# Patient Record
Sex: Female | Born: 1935 | Race: White | Hispanic: No
Health system: Southern US, Community
[De-identification: ages and names within clinical notes are randomized; demographics above are authoritative.]

---

## 2005-08-29 ENCOUNTER — Inpatient Hospital Stay: Payer: Self-pay | Admitting: Unknown Physician Specialty

## 2006-04-27 ENCOUNTER — Ambulatory Visit: Payer: Self-pay

## 2006-07-21 ENCOUNTER — Ambulatory Visit: Payer: Self-pay | Admitting: Otolaryngology

## 2006-07-21 ENCOUNTER — Other Ambulatory Visit: Payer: Self-pay

## 2006-07-30 ENCOUNTER — Ambulatory Visit: Payer: Self-pay | Admitting: Otolaryngology

## 2006-11-29 ENCOUNTER — Emergency Department: Payer: Self-pay | Admitting: Emergency Medicine

## 2007-09-09 ENCOUNTER — Ambulatory Visit: Payer: Self-pay | Admitting: Ophthalmology

## 2007-09-15 ENCOUNTER — Ambulatory Visit: Payer: Self-pay | Admitting: Ophthalmology

## 2007-09-15 ENCOUNTER — Other Ambulatory Visit: Payer: Self-pay

## 2007-10-11 ENCOUNTER — Ambulatory Visit: Payer: Self-pay | Admitting: Ophthalmology

## 2008-03-05 ENCOUNTER — Inpatient Hospital Stay: Payer: Self-pay | Admitting: Internal Medicine

## 2008-03-05 ENCOUNTER — Other Ambulatory Visit: Payer: Self-pay

## 2008-03-20 ENCOUNTER — Ambulatory Visit: Payer: Self-pay | Admitting: Specialist

## 2008-03-23 ENCOUNTER — Ambulatory Visit: Payer: Self-pay | Admitting: Internal Medicine

## 2008-06-26 ENCOUNTER — Ambulatory Visit: Payer: Self-pay | Admitting: Gastroenterology

## 2008-07-29 ENCOUNTER — Emergency Department: Payer: Self-pay | Admitting: Emergency Medicine

## 2008-11-22 ENCOUNTER — Emergency Department: Payer: Self-pay | Admitting: Emergency Medicine

## 2008-12-07 ENCOUNTER — Inpatient Hospital Stay: Payer: Self-pay | Admitting: Internal Medicine

## 2009-07-11 IMAGING — CR DG CHEST 1V PORT
1 series · 1 of 1 positions shown · non-contrast
Comparison: none

REASON FOR EXAM: Altered mental status, leukocytosis
COMMENTS:

[view not recorded]
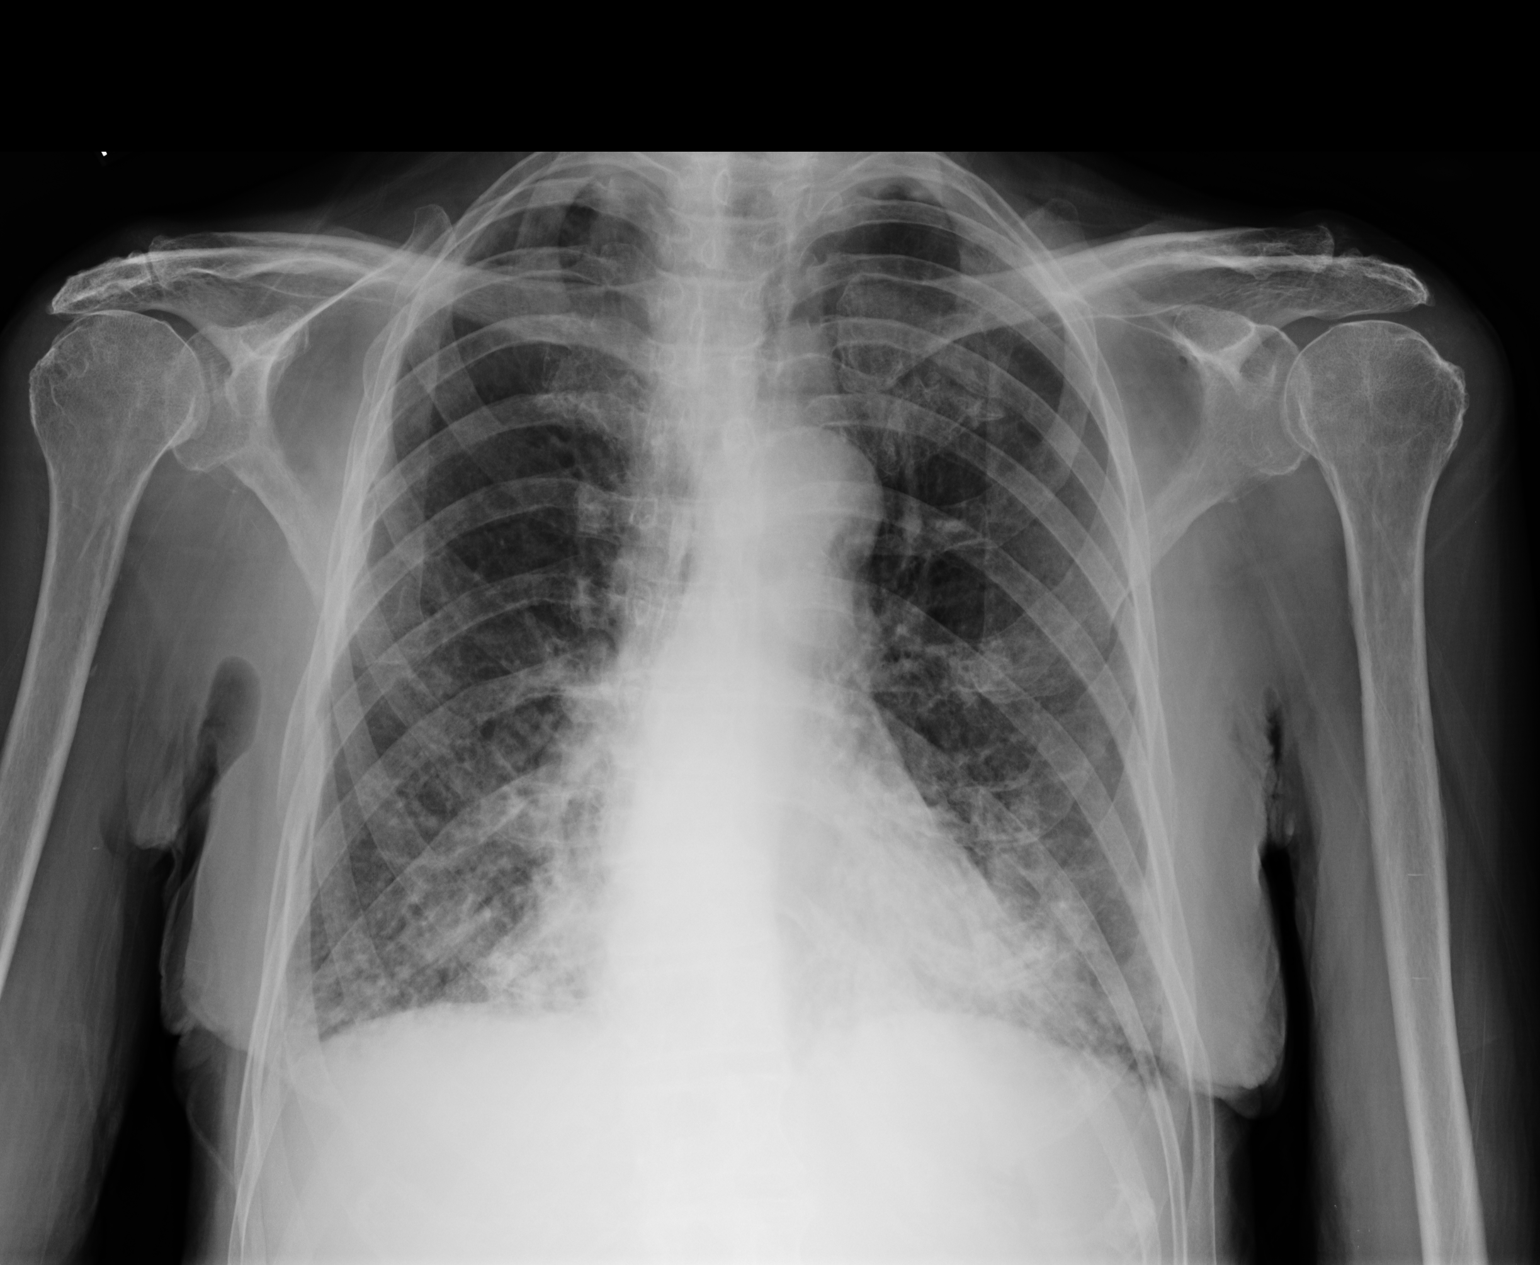

[1 of 1 positions shown; findings below may reference images not displayed]

PROCEDURE:     DXR - DXR PORTABLE CHEST SINGLE VIEW  - December 07, 2008  [DATE]

RESULT:     AP chest is obtained.

Pulmonary interstitial prominence is noted with Kerley B-lines. Mild
pulmonary interstitial edema cannot be completely excluded. Mild pneumonitis
cannot be excluded. There may be a component of interstitial fibrosis.
Follow-up chest x-rays are suggested.
IMPRESSION: Mild bilateral pulmonary interstitial prominence. Developing
congestive heart failure could present in this fashion as could pneumonitis.
Follow-up chest x-rays are suggested.

## 2009-09-07 ENCOUNTER — Emergency Department: Payer: Self-pay | Admitting: Emergency Medicine

## 2011-01-04 ENCOUNTER — Emergency Department: Payer: Self-pay | Admitting: Emergency Medicine

## 2012-04-01 ENCOUNTER — Inpatient Hospital Stay: Payer: Self-pay | Admitting: Internal Medicine

## 2012-04-01 LAB — COMPREHENSIVE METABOLIC PANEL
Bilirubin,Total: 0.7 mg/dL (ref 0.2–1.0)
Calcium, Total: 9.1 mg/dL (ref 8.5–10.1)
Co2: 29 mmol/L (ref 21–32)
EGFR (African American): >60
EGFR (Non-African Amer.): >60
Glucose: 140 mg/dL — ABNORMAL HIGH (ref 65–99)
Potassium: 3.8 mmol/L (ref 3.5–5.1)
SGOT(AST): 14 U/L — ABNORMAL LOW (ref 15–37)
SGPT (ALT): 13 U/L
Sodium: 137 mmol/L (ref 136–145)
Total Protein: 8.7 g/dL — ABNORMAL HIGH (ref 6.4–8.2)

## 2012-04-01 LAB — URINALYSIS, COMPLETE
Bilirubin,UR: NEGATIVE
Blood: NEGATIVE
Glucose,UR: 50 mg/dL (ref 0–75)
Leukocyte Esterase: NEGATIVE
Nitrite: NEGATIVE
Ph: 5 (ref 4.5–8.0)
Protein: 30
RBC,UR: 2 /HPF (ref 0–5)
Specific Gravity: 1.024 (ref 1.003–1.030)
Squamous Epithelial: 1
WBC UR: 1 /HPF (ref 0–5)

## 2012-04-01 LAB — CBC
HCT: 43.7 % (ref 35.0–47.0)
HGB: 14.2 g/dL (ref 12.0–16.0)
MCV: 89 fL (ref 80–100)
Platelet: 275 10*3/uL (ref 150–440)
RBC: 4.89 10*6/uL (ref 3.80–5.20)
RDW: 13.8 % (ref 11.5–14.5)

## 2012-04-02 LAB — CBC WITH DIFFERENTIAL/PLATELET
Basophil %: 0.2 %
Eosinophil #: 0.1 10*3/uL (ref 0.0–0.7)
Eosinophil %: 0.3 %
HCT: 40.9 % (ref 35.0–47.0)
Lymphocyte %: 11.3 %
MCH: 28.5 pg (ref 26.0–34.0)
MCV: 90 fL (ref 80–100)
Monocyte #: 1.9 x10 3/mm — ABNORMAL HIGH (ref 0.2–0.9)
Neutrophil #: 14.1 10*3/uL — ABNORMAL HIGH (ref 1.4–6.5)
WBC: 18.1 10*3/uL — ABNORMAL HIGH (ref 3.6–11.0)

## 2012-04-02 LAB — BASIC METABOLIC PANEL
Anion Gap: 10 (ref 7–16)
BUN: 9 mg/dL (ref 7–18)
Calcium, Total: 9.4 mg/dL (ref 8.5–10.1)
Chloride: 98 mmol/L (ref 98–107)
Creatinine: 0.65 mg/dL (ref 0.60–1.30)
EGFR (African American): >60
EGFR (Non-African Amer.): >60
Glucose: 118 mg/dL — ABNORMAL HIGH (ref 65–99)
Osmolality: 274 (ref 275–301)

## 2012-04-04 LAB — WBC: WBC: 10.5 10*3/uL (ref 3.6–11.0)

## 2012-04-07 LAB — CULTURE, BLOOD (SINGLE)

## 2013-04-04 ENCOUNTER — Emergency Department: Payer: Self-pay | Admitting: Emergency Medicine

## 2013-04-15 ENCOUNTER — Other Ambulatory Visit: Payer: Self-pay | Admitting: Family Medicine

## 2013-04-15 LAB — PRO B NATRIURETIC PEPTIDE: B-Type Natriuretic Peptide: 98 pg/mL (ref 0–450)

## 2013-10-06 ENCOUNTER — Emergency Department: Payer: Self-pay | Admitting: Emergency Medicine

## 2013-10-12 ENCOUNTER — Emergency Department: Payer: Self-pay | Admitting: Internal Medicine

## 2014-05-10 IMAGING — CT CT HEAD WITHOUT CONTRAST
1 series · 15 of 30 positions shown, 19 images · non-contrast
Comparison: none

REASON FOR EXAM: head trauma s/p fall
COMMENTS:   LMP: Post-Menopausal

[Series 2: soft tissue · axial · 0.42mm/px · z∈[+286,+420]mm · 15 of 31 slices shown, 19 images]
[im 2/31  brain]
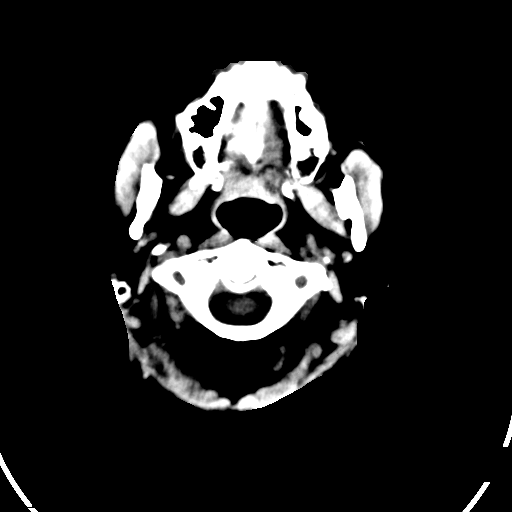
[im 2/31  bone]
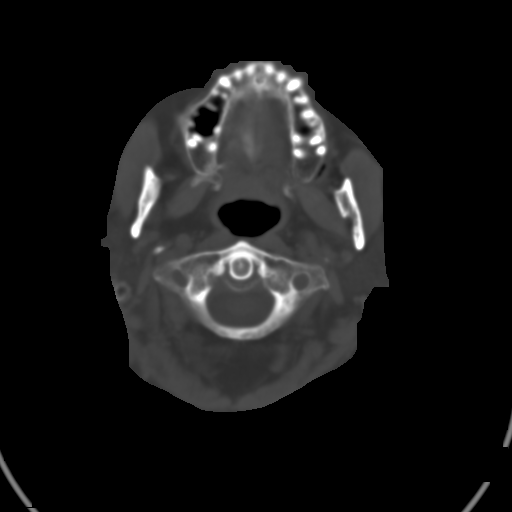
[im 4/31  brain]
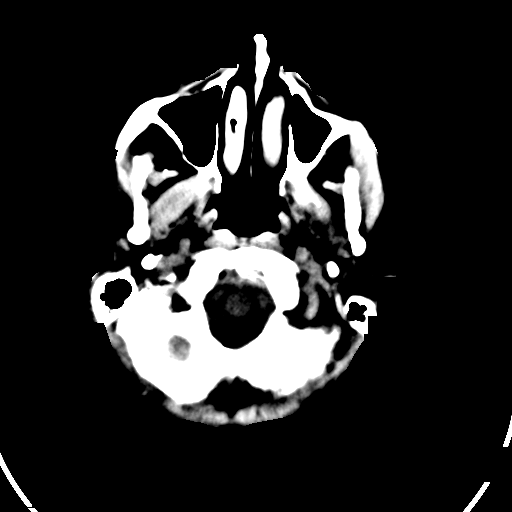
[im 6/31  brain]
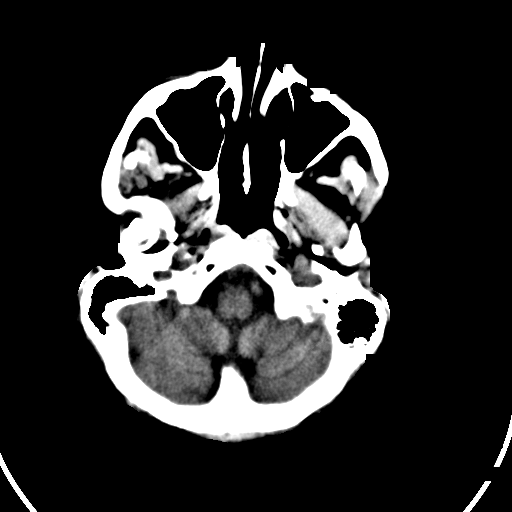
[im 8/31  brain]
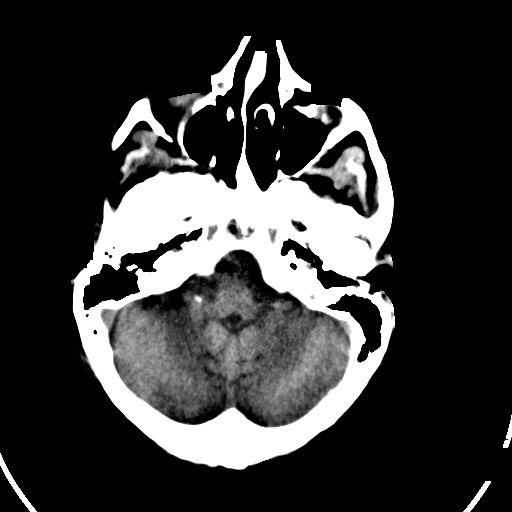
[im 10/31  brain]
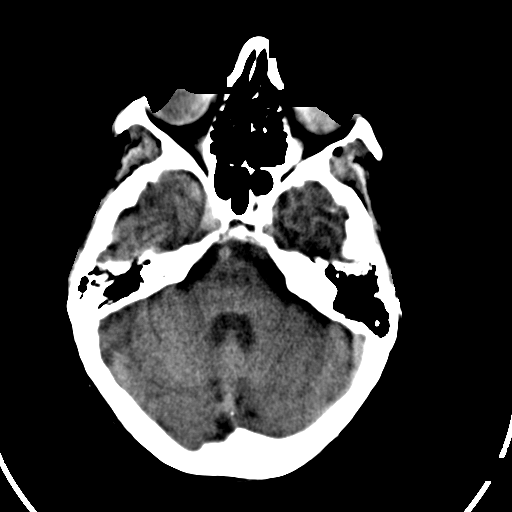
[im 10/31  bone]
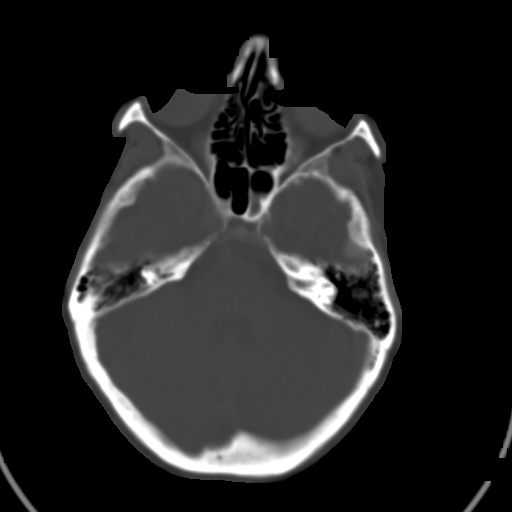
[im 12/31  brain]
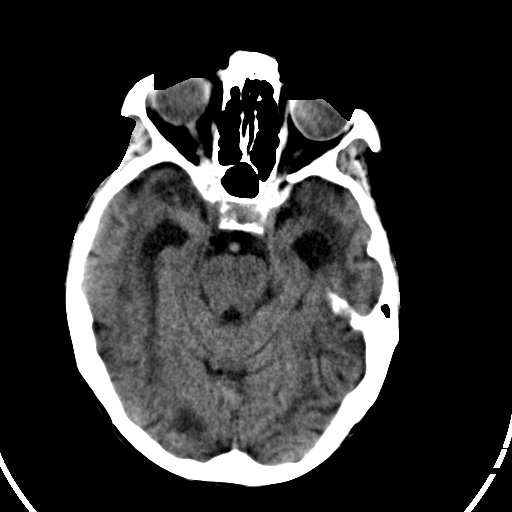
[im 14/31  brain]
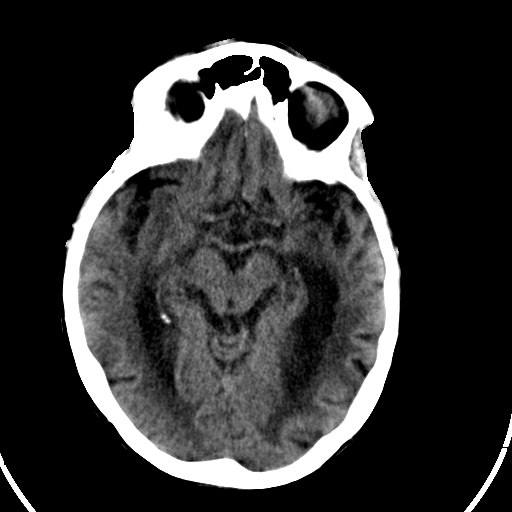
[im 16/31  brain]
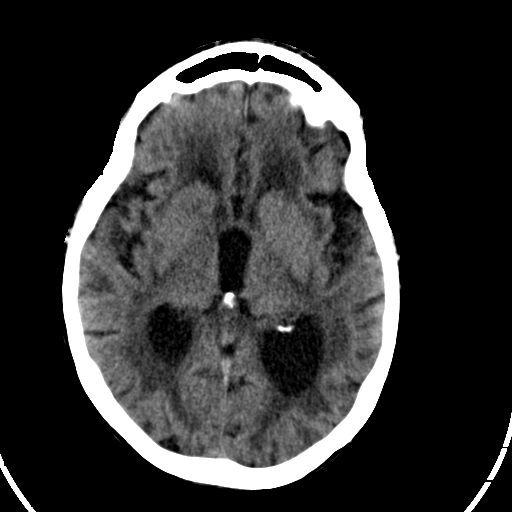
[im 17/31  brain]
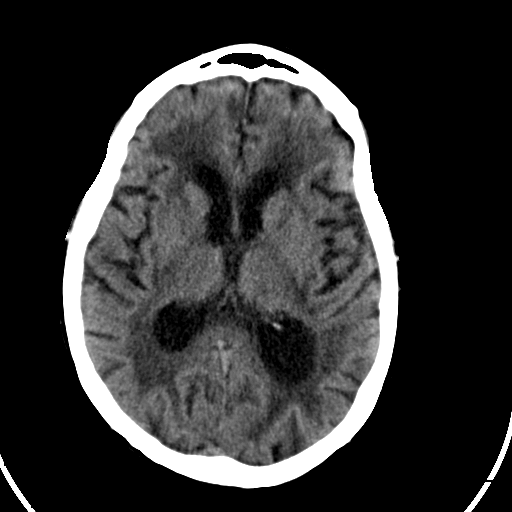
[im 17/31  bone]
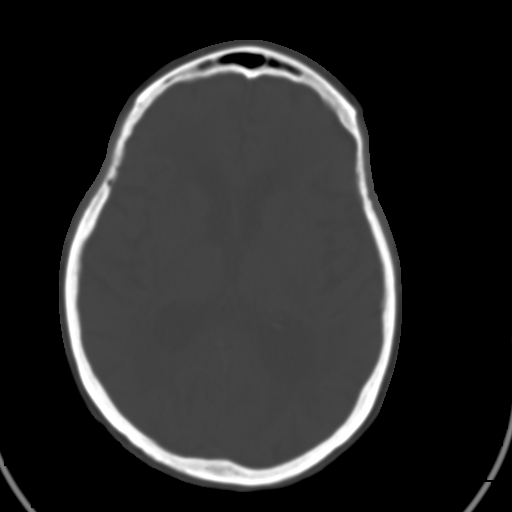
[im 19/31  brain]
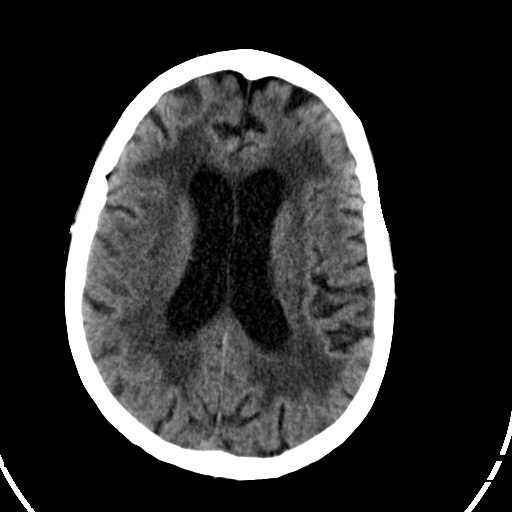
[im 21/31  brain]
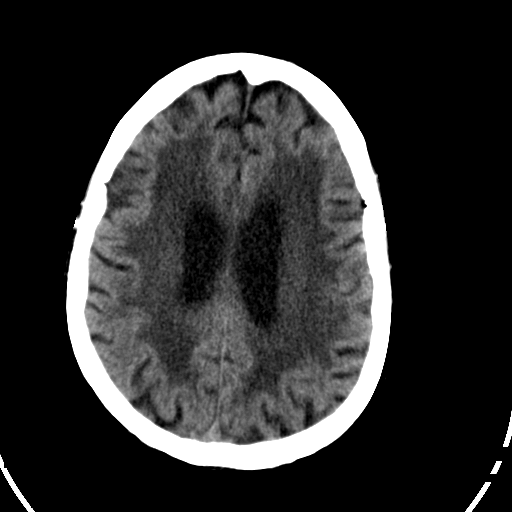
[im 23/31  brain]
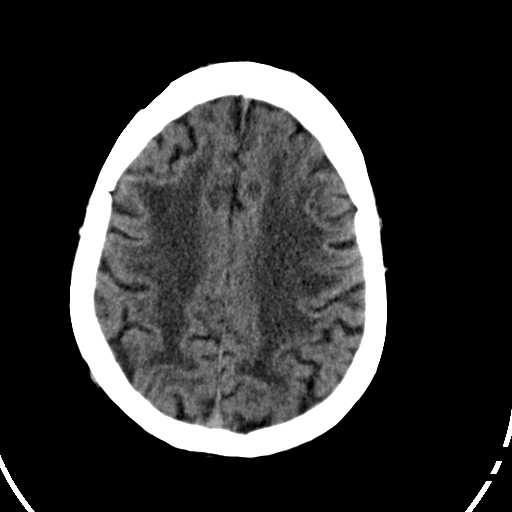
[im 25/31  brain]
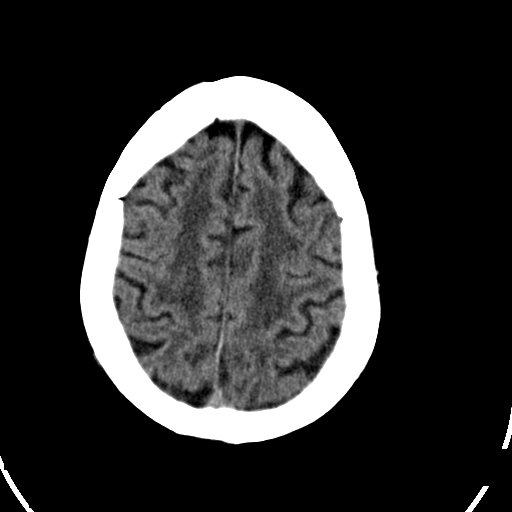
[im 25/31  bone]
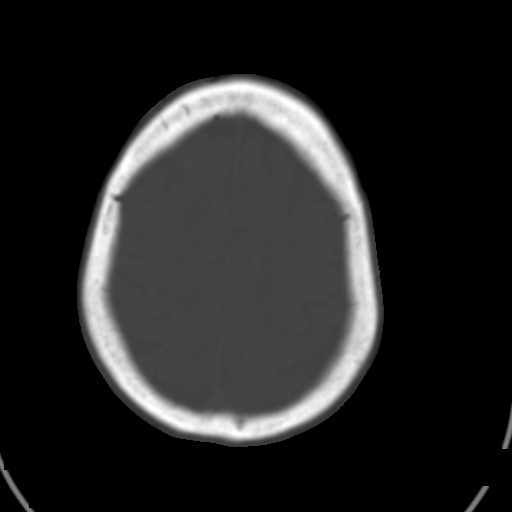
[im 27/31  brain]
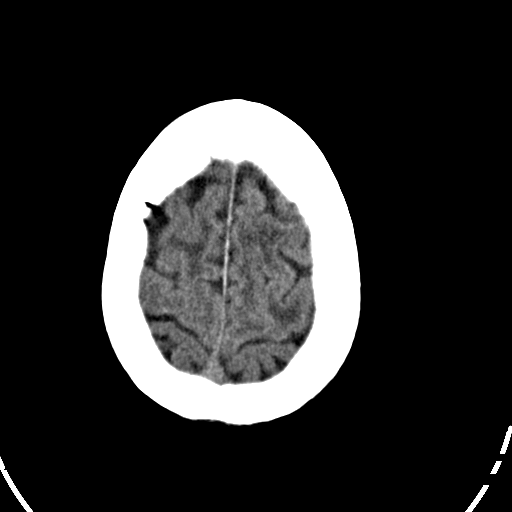
[im 29/31  brain]
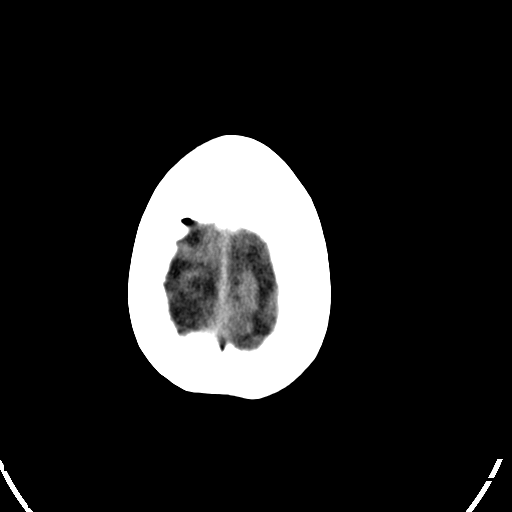

[15 of 30 positions shown; findings below may reference images not displayed]

PROCEDURE:     CT  - CT HEAD WITHOUT CONTRAST  - October 06, 2013 [DATE]

RESULT:     Axial noncontrast CT scanning was performed through the brain
with reconstructions at 5 mm intervals and slice thicknesses. Comparison is
made to the study April 01, 2012.

There is mild diffuse cerebral and cerebellar atrophy with compensatory
ventriculomegaly. There is extensively decreased density in the deep white
matter of both cerebral hemispheres consistent with chronic small vessel
ischemia. Cerebellum and brainstem are normal in appearance. There is no
evidence of an acute intracranial hemorrhage nor of an evolving ischemic
infarction. At bone window settings there is no evidence of an acute skull
fracture. The observed portions of the paranasal sinuses and mastoid air
cells are clear.
IMPRESSION: 1. There are age related atrophic changes present as well as evidence of
chronic small vessel ischemia.
2. There is no evidence of an acute intracranial hemorrhage nor of an acute
ischemic infarction.
3. There is no evidence of an acute skull fracture.

[REDACTED]

## 2014-06-21 ENCOUNTER — Ambulatory Visit: Payer: Self-pay | Admitting: Internal Medicine

## 2014-06-28 ENCOUNTER — Inpatient Hospital Stay: Payer: Self-pay | Admitting: Family Medicine

## 2014-06-28 LAB — CBC WITH DIFFERENTIAL/PLATELET
Basophil #: 0.2 10*3/uL — ABNORMAL HIGH (ref 0.0–0.1)
Basophil %: 0.6 %
EOS PCT: 0 %
Eosinophil #: 0 10*3/uL (ref 0.0–0.7)
HCT: 44.9 % (ref 35.0–47.0)
HGB: 14.1 g/dL (ref 12.0–16.0)
LYMPHS ABS: 2.8 10*3/uL (ref 1.0–3.6)
Lymphocyte %: 7.8 %
MCH: 28.4 pg (ref 26.0–34.0)
MCHC: 31.4 g/dL — ABNORMAL LOW (ref 32.0–36.0)
MCV: 90 fL (ref 80–100)
MONO ABS: 1.6 x10 3/mm — AB (ref 0.2–0.9)
MONOS PCT: 4.4 %
Neutrophil #: 31.1 10*3/uL — ABNORMAL HIGH (ref 1.4–6.5)
Neutrophil %: 87.2 %
PLATELETS: 548 10*3/uL — AB (ref 150–440)
RBC: 4.96 10*6/uL (ref 3.80–5.20)
RDW: 14 % (ref 11.5–14.5)
WBC: 35.7 10*3/uL — ABNORMAL HIGH (ref 3.6–11.0)

## 2014-06-28 LAB — BASIC METABOLIC PANEL
Anion Gap: 11 (ref 7–16)
BUN: 36 mg/dL — ABNORMAL HIGH (ref 7–18)
Calcium, Total: 9 mg/dL (ref 8.5–10.1)
Chloride: 119 mmol/L — ABNORMAL HIGH (ref 98–107)
Co2: 26 mmol/L (ref 21–32)
Creatinine: 1.31 mg/dL — ABNORMAL HIGH (ref 0.60–1.30)
EGFR (African American): 41 — ABNORMAL LOW
EGFR (Non-African Amer.): 36 — ABNORMAL LOW
Glucose: 178 mg/dL — ABNORMAL HIGH (ref 65–99)
Osmolality: 322 (ref 275–301)
Potassium: 4.3 mmol/L (ref 3.5–5.1)
Sodium: 156 mmol/L — ABNORMAL HIGH (ref 136–145)

## 2014-06-28 LAB — HEPATIC FUNCTION PANEL A (ARMC)
ALBUMIN: 2.7 g/dL — AB (ref 3.4–5.0)
ALK PHOS: 109 U/L
BILIRUBIN TOTAL: 0.8 mg/dL (ref 0.2–1.0)
Bilirubin, Direct: 0.1 mg/dL (ref 0.00–0.20)
SGOT(AST): 36 U/L (ref 15–37)
SGPT (ALT): 46 U/L (ref 12–78)
Total Protein: 8.9 g/dL — ABNORMAL HIGH (ref 6.4–8.2)

## 2014-06-28 LAB — URINALYSIS, COMPLETE
Bacteria: NONE SEEN
Bilirubin,UR: NEGATIVE
Blood: NEGATIVE
GLUCOSE, UR: NEGATIVE mg/dL (ref 0–75)
KETONE: NEGATIVE
Leukocyte Esterase: NEGATIVE
Nitrite: NEGATIVE
PROTEIN: NEGATIVE
Ph: 5 (ref 4.5–8.0)
RBC,UR: <1 /HPF (ref 0–5)
SPECIFIC GRAVITY: 1.021 (ref 1.003–1.030)
WBC UR: 2 /HPF (ref 0–5)

## 2014-06-28 LAB — TROPONIN I
Troponin-I: <0.02 ng/mL
Troponin-I: <0.02 ng/mL

## 2014-06-28 LAB — CK-MB
CK-MB: 0.6 ng/mL (ref 0.5–3.6)
CK-MB: 1.6 ng/mL (ref 0.5–3.6)

## 2014-06-29 LAB — CBC WITH DIFFERENTIAL/PLATELET
BASOS PCT: 0.6 %
Basophil #: 0.2 10*3/uL — ABNORMAL HIGH (ref 0.0–0.1)
EOS ABS: 0.1 10*3/uL (ref 0.0–0.7)
Eosinophil %: 0.5 %
HCT: 41.1 % (ref 35.0–47.0)
HGB: 12.8 g/dL (ref 12.0–16.0)
LYMPHS PCT: 10.7 %
Lymphocyte #: 2.7 10*3/uL (ref 1.0–3.6)
MCH: 28.4 pg (ref 26.0–34.0)
MCHC: 31.2 g/dL — ABNORMAL LOW (ref 32.0–36.0)
MCV: 91 fL (ref 80–100)
MONO ABS: 1.5 x10 3/mm — AB (ref 0.2–0.9)
Monocyte %: 6 %
NEUTROS PCT: 82.2 %
Neutrophil #: 20.7 10*3/uL — ABNORMAL HIGH (ref 1.4–6.5)
Platelet: 416 10*3/uL (ref 150–440)
RBC: 4.52 10*6/uL (ref 3.80–5.20)
RDW: 13.6 % (ref 11.5–14.5)
WBC: 25.2 10*3/uL — ABNORMAL HIGH (ref 3.6–11.0)

## 2014-06-29 LAB — COMPREHENSIVE METABOLIC PANEL
Albumin: 2.1 g/dL — ABNORMAL LOW (ref 3.4–5.0)
Alkaline Phosphatase: 86 U/L
Anion Gap: 11 (ref 7–16)
BILIRUBIN TOTAL: 0.6 mg/dL (ref 0.2–1.0)
BUN: 29 mg/dL — ABNORMAL HIGH (ref 7–18)
CALCIUM: 8.4 mg/dL — AB (ref 8.5–10.1)
CHLORIDE: 121 mmol/L — AB (ref 98–107)
CREATININE: 1.1 mg/dL (ref 0.60–1.30)
Co2: 22 mmol/L (ref 21–32)
EGFR (African American): 51 — ABNORMAL LOW
EGFR (Non-African Amer.): 44 — ABNORMAL LOW
GLUCOSE: 139 mg/dL — AB (ref 65–99)
Osmolality: 314 (ref 275–301)
Potassium: 3.7 mmol/L (ref 3.5–5.1)
SGOT(AST): 27 U/L (ref 15–37)
SGPT (ALT): 32 U/L (ref 12–78)
Sodium: 154 mmol/L — ABNORMAL HIGH (ref 136–145)
TOTAL PROTEIN: 8.1 g/dL (ref 6.4–8.2)

## 2014-06-30 LAB — CBC WITH DIFFERENTIAL/PLATELET
Basophil #: 0.1 10*3/uL (ref 0.0–0.1)
Basophil %: 0.3 %
EOS PCT: 1.3 %
Eosinophil #: 0.3 10*3/uL (ref 0.0–0.7)
HCT: 38.8 % (ref 35.0–47.0)
HGB: 12.1 g/dL (ref 12.0–16.0)
Lymphocyte #: 2 10*3/uL (ref 1.0–3.6)
Lymphocyte %: 8.6 %
MCH: 28.5 pg (ref 26.0–34.0)
MCHC: 31.2 g/dL — AB (ref 32.0–36.0)
MCV: 91 fL (ref 80–100)
MONO ABS: 1 x10 3/mm — AB (ref 0.2–0.9)
MONOS PCT: 4.3 %
NEUTROS ABS: 19.8 10*3/uL — AB (ref 1.4–6.5)
Neutrophil %: 85.5 %
PLATELETS: 340 10*3/uL (ref 150–440)
RBC: 4.25 10*6/uL (ref 3.80–5.20)
RDW: 13.9 % (ref 11.5–14.5)
WBC: 23.2 10*3/uL — ABNORMAL HIGH (ref 3.6–11.0)

## 2014-06-30 LAB — BASIC METABOLIC PANEL
ANION GAP: 11 (ref 7–16)
BUN: 20 mg/dL — ABNORMAL HIGH (ref 7–18)
CALCIUM: 8 mg/dL — AB (ref 8.5–10.1)
CHLORIDE: 117 mmol/L — AB (ref 98–107)
CO2: 20 mmol/L — AB (ref 21–32)
Creatinine: 1.06 mg/dL (ref 0.60–1.30)
EGFR (African American): 54 — ABNORMAL LOW
EGFR (Non-African Amer.): 46 — ABNORMAL LOW
Glucose: 116 mg/dL — ABNORMAL HIGH (ref 65–99)
Osmolality: 298 (ref 275–301)
Potassium: 3.5 mmol/L (ref 3.5–5.1)
Sodium: 148 mmol/L — ABNORMAL HIGH (ref 136–145)

## 2014-07-02 LAB — URINE CULTURE

## 2014-07-03 LAB — CULTURE, BLOOD (SINGLE)

## 2014-07-22 ENCOUNTER — Ambulatory Visit: Payer: Self-pay | Admitting: Internal Medicine

## 2014-08-22 DEATH — deceased

## 2015-04-14 NOTE — Discharge Summary (Signed)
PATIENT NAME:  [name], [name] DATE OF BIRTH:  [birth year]  DATE OF ADMISSION:  06/28/2014 DATE OF DISCHARGE:  06/30/2014  DISCHARGE DIAGNOSES: 1.  Leukocytosis.  2.  Severe dementia.  3.  Failure to thrive.   DISCHARGE MEDICATIONS: Sertraline 50 mg p.o. daily, Gummy Bears multivitamin daily, Ensure Plus t.i.d. with meals. Other medications per hospice order sets.   CONSULTANTS: Palliative care.   PROCEDURES: None.  PERTINENT LABORATORIES AND STUDIES: On day of discharge, her white blood cell count 23.2, hemoglobin 12.1, and platelets 340,000. Sodium 148, potassium 3.5, creatinine 1.06. Cardiac enzymes are negative.   Chest x-ray was negative. Renal ultrasound negative. Urinalysis negative. Blood cultures: No growth to date.   BRIEF HOSPITAL COURSE:  1.  Leukocytosis. The patient initially came in with acute leukocytosis and weakness with white blood cell count as high as 35.7; on day of discharge 23.2. No infectious source was found. She remained afebrile; therefore, she is going to be discharged without antibiotic therapy. She was evaluated by palliative care and plan to pursue hospice care.  2.  Other chronic issues, severe dementia and failure to thrive, progressively worsening. Given her clinical state it is recommended that she is followed by hospice at home and they can provide the resources that she needs.   DISPOSITION: She is in a guarded state due to her failure to thrive and severe dementia. The family refused further CML work-up at this time. She will follow up with me in the outpatient as needed, otherwise hospice will follow her.   ____________________________ Jeanette IvanKanhka Salina Stanfield, MD kl:sb D: 06/30/2014 08:13:29 ET T: 06/30/2014 09:54:07 ET JOB#: [id number]  cc: Jeanette IvanKanhka Alicha Raspberry, MD, <Dictator> Jeanette IvanKANHKA Nyrah Demos MD ELECTRONICALLY SIGNED 07/16/2014 8:05

## 2015-04-14 NOTE — H&P (Signed)
PATIENT NAME:  Jeanette CraftsGARRISON, Brynnly F MR#:  [id number] DATE OF BIRTH:  [birth year]  DATE OF ADMISSION:  06/28/2014  PRIMARY CARE PHYSICIAN:  Marisue IvanKanhka Linthavong, MD.    HISTORY OF PRESENT ILLNESS:  The patient is a 81 year old Caucasian female with history of dementia, who was sent from physician's office due to abnormal labs.  Apparently, the patient's white blood cell count was elevated to 37 and above, and patient was sent to the hospital because of elevated white blood cell count to look for possible infection.  In the hospital, she was noted to have normal vital signs, and she was afebrile. She was, however, felt to be dehydrated with elevated creatinine level, as well as elevated sodium level to 156. Hospitalist services were contacted for admission. The patient's ABGs revealed elevated lactic acid level of 22.1.   PAST MEDICAL HISTORY: Significant for history of admission in April 2015, for pneumonia.  It is also significant for progressive worsening dementia and history of breast cancer in remission.   PAST SURGICAL HISTORY: Cholecystectomy, bilateral mastectomy, hysterectomy, as well as left rotator cuff tear operation.  ALLERGIES: CODEINE, MORPHINE, AS WELL AS TAPE.  MEDICATIONS: According to medical records, the patient is on aspirin 81 mg p.o. daily,  calcium carbonate 300 mg p.o. daily, multivitamins once daily, risperidone 0.5 mg one tablet every 8 hours as needed, Sertraline 75 mg p.o. daily.  It is unclear if this medication list is current though.    The patient was discharged in April 2015, on aspirin 81 mg p.o. daily, Zoloft 75 mg p.o. daily, calcium carbonate 300 mg p.o. daily, multivitamins 1 tablet once daily, Risperidone 0.5 mg every 8 hours as needed.   SOCIAL HISTORY: Currently lives at home with son. No smoking or alcohol abuse of note.  The patient has been cared by a caregiver.   FAMILY HISTORY: No significant medical problems in the family.  Both parents lived into the 5890s, or  late 6680s.   REVIEW OF SYSTEMS: Not available since the patient is severely demented. She is able to only to mumble.   PHYSICAL EXAMINATION:  VITAL SIGNS:  On arrival to the hospital in the Emergency Room, patient was afebrile with a temperature of 97.7, pulse was 88, respiration was 18, blood pressure 172/103, saturation was 98% on room air.  GENERAL: This is a well-developed, well-nourished, thin Caucasian female in no significant distress, somewhat uncomfortable and anxious looking around in the room.   HEENT:  Her pupils are equal and reactive to light. Extraocular movements intact. No icterus or conjunctivitis. Has normal hearing. No pharyngeal edema.  Mucosa is dry.  NECK: No masses. Supple, nontender. Thyroid is not enlarged. No adenopathy. No JVD or carotid bruits bilaterally. Full range of motion.  LUNGS: Clear to auscultation anteriorly.  Clear in all fields.  No wheezing.  No labored inspirations, increased effort, dullness to percussion or overt respiratory distress.  CARDIOVASCULAR: S1, S2 appreciated. No murmurs, gallops, rubs.  Rhythm is regular.   PMI not lateralized.  Chest is nontender to palpation with 1+ pulses.  No lower extremity edema, calf tenderness or cyanosis was noted.  ABDOMEN: Soft.  No significant tenderness.  No hepatosplenomegaly or masses were noted. The patient is somewhat uncomfortable whenever palpate her abdomen, but I am not absolutely convinced that it is any discomfort or pain.  RECTAL: Deferred.  MUSCLE STRENGTH: Able to move all extremities. No cyanosis, degenerative joint disease or kyphosis. Gait was not tested.  SKIN: Did not reveal any acne,  rashes, lesions, erythema, nodularity, induration. It was warm and dry to palpation. The patient's legs and her feet seem to be cold. Peripheral pulses are somewhat diminished.  LYMPHATIC: No adenopathy in the cervical region.  NEUROLOGICAL: Cranial nerves grossly intact. Sensory is difficult to evaluate. Not able to  assess for dysarthria or aphasia. The patient is alert, not cooperative. Memory is severely impaired. She is somewhat anxious-looking.  She is able to [facility], but that is about it, to how much she is able to express herself.   LABORATORY DATA: BMP showed BUN and creatinine of 36 and 1.31, sodium 156, glucose 178. Otherwise, BMP was unremarkable. The patient's sodium level is low at 2.7 and total protein is elevated at 8.9. Troponin less than 0.02. White blood cell count is elevated at 35.7, being normal in April 2015.  At that time, it was 10.5.  The patient's hemoglobin level was 14.1, and platelet count of 548,000. Urinalysis was unremarkable with less than 1 red blood cell and 2 white blood cells. ABGs were performed on room air, pH of 7.48, pCO2 was 29, pO2 of 78, saturation was not checked.  Patient's lactic acid level was 2.1. EKG is not done.   RADIOLOGIC STUDIES: Chest x-ray, portable single view, 06/28/2014, showed fibrotic changes without acute abnormality.   ASSESSMENT AND PLAN:  1.  Admit the patient to the medical floor. Get blood cultures taken, as well as stool cultures if patient has any diarrhea. Start the patient on vancomycin or Zosyn.  Following patient's white blood cell count.   2.  Dehydration. Continue the patient on IV fluids.   3.  Hyponatremia. If it is free of water deficit, we will continue the patient on D5 water. We will follow the patient's sodium level frequently.   4.  Renal insufficiency and follow up with rehydration.  The patient has already been given 1 liter of IV fluids here in the Emergency Room.   5.  Leukocytosis of unclear etiology, concerning for possible infection. We will follow with broad-spectrum antibiotic therapy.   TIME SPENT: 50 minutes on this patient.     ____________________________ Katharina Caper, MD rv:ts D: 06/28/2014 16:32:37 ET T: 06/28/2014 17:11:27 ET JOB#: [id number]  cc: Katharina Caper, MD, <Dictator> Marisue Ivan, MD Clarabel Marion MD ELECTRONICALLY SIGNED 07/18/2014 13:18

## 2015-04-14 NOTE — Consult Note (Signed)
   Comments   Dr. Ermalinda Memos and I met with son, Dominica Severin.  Pt does live with Dominica Severin and has a personal caregiver come in 7 days a week from 0700-1300.  Pt is total care and bed bound.  Dominica Severin states he would like hospice services to help in their home.  From speaking with Dominica Severin and assessment pt is a FAST 7C and PPS 30%. states the brother, Ronalee Belts, is HCPOA and both of them speak about pt's healthcare decisions.  There is no plans to switch residence's and they state b/c of pt's mental status they cannot change Dominica Severin to [place]. says that family would not want to pursue work-up of potential CML.  Dx: Alzheimers dementia          Secondary Dx: FTT (BMI 17.9, PPS 30%)  Electronic Signatures: Maudry Mayhew (NP)  (Signed 09-Jul-15 13:18)  Authored: Palliative Care Phifer, Izora Gala (MD)  (Signed 09-Jul-15 13:36)  Authored: Palliative Care   Last Updated: 09-Jul-15 13:36 by Phifer, Izora Gala (MD)

## 2015-04-15 NOTE — Discharge Summary (Signed)
PATIENT NAME:  [name], [name] MR#:  [id number] DATE OF BIRTH:  05-03-1922  DATE OF ADMISSION:  04/01/2012 DATE OF DISCHARGE:  04/04/2012  ADMITTING PHYSICIAN: Enid Baasadhika Estefanny Moler, MD   DISCHARGING PHYSICIAN: Enid Baasadhika Paden Senger, MD    PRIMARY MD: Alonna BucklerAndrew Lamb, MD   CONSULTATIONS IN THE HOSPITAL: None.   DISCHARGE DIAGNOSES:  1. Pneumonia.  2. Leukocytosis.  3. Progressively worsening dementia.  4. Neck pain from degenerative disk disease.   DISCHARGE MEDICATIONS:  1. Aspirin 81 mg p.o. daily.  2. Zoloft 75 mg p.o. daily.  3. Calcium carbonate 600 mg p.o. daily. 4. Gummy Bears Multivitamin 1 tablet p.o. daily.  5. Levaquin 750 mg p.o. daily until 04/10/2012. 6. Risperidone 0.5 mg oral disintegrating tablet q.8 hours p.r.n. for agitation.   DISCHARGE DIET: Regular diet.   DISCHARGE ACTIVITY: As tolerated.    FOLLOW-UP INSTRUCTIONS: Follow-up with PCP in 1 to 2 weeks.   LABS AND IMAGING STUDIES: WBC at the time of discharge was 10.5. WBC on admission was 19.7. Hemoglobin 13.0, hematocrit 40.9, platelet count 295, sodium 137, potassium 3.7, chloride 98, bicarb 29, BUN 9, creatinine 0.65, glucose 118, calcium 9.4. Blood cultures have remained negative.   CT of the C-spine showing anterolisthesis mildly at 2 mm at C7-T1. No evidence of acute osseous abnormalities. Multilevel degenerative changes. Scattered bilateral nodular densities within the lung apices likely representing biapical pleural scarring.   Chest x-ray on admission showing COPD and interstitial fibrotic changes within the lung bases. Mild interstitial infiltrate cannot be excluded.   C-spine x-ray showing degenerative changes without acute osseous abnormalities.   Urinalysis negative for any infection.   CT of the head showing no intracranial process. Chronic microangiopathic changes.   ALT 13, AST 14, alkaline phosphatase 112, total bilirubin 0.7, albumin 3.2.   BRIEF HOSPITAL COURSE: [name] is a pleasant  [age] year old elderly lady with history of dementia. Her son is her primary caregiver at home. She was brought in for altered mental status, generalized weakness requiring two person assistance instead of one person assist which is her baseline. She was found to have leukocytosis and possible infiltrate on chest x-ray. She was admitted for pneumonia.  1. Possible pneumonia with interstitial infiltrate seen on chest x-ray along with leukocytosis. Urinalysis was negative for any infection. Possible cause of her weakness was probably secondary to pneumonia. She was started on Rocephin and azithromycin while in the hospital with significant improvement and white count normalized. Her strength is back to her baseline. She is a very difficult historian secondary to her dementia. She is being discharged on Levaquin to finish off the course.  2. Neck pain on admission. It was very tender, possibly muscular pain from degenerative disk disease as seen on CT, C-spine, and also on the x-ray. Her pain had resolved a lot at the time of discharge.  3. Altered mental status on presentation, likely metabolic encephalopathy from underlying infection.  4. Dementia. She is on Zoloft for dementia which we will continue at this time.   Overall course has been otherwise uneventful in the hospital.   DISCHARGE CONDITION: Stable.   DISCHARGE DISPOSITION: Home.   TIME SPENT ON DISCHARGE: 45 minutes.   ____________________________ Enid Baasadhika Amyr Sluder, MD rk:drc D: 04/05/2012 15:29:31 ET T: 04/06/2012 12:10:56 ET JOB#: [id number]  cc: Enid Baasadhika Zenya Hickam, MD, <Dictator> Reola MosherAndrew S. Randa LynnLamb, MD Enid BaasADHIKA Laverne Hursey MD ELECTRONICALLY SIGNED 04/06/2012 15:06

## 2015-04-15 NOTE — H&P (Signed)
PATIENT NAME:  [name], [name] MR#:  [id number] DATE OF BIRTH:  30-Jan-1922  DATE OF ADMISSION:  04/01/2012  ADMITTING PHYSICIAN:  Dr. Nemiah CommanderKalisetti PRIMARY CARE PHYSICIAN: Dr. Fidela JuneauAndy Lamb   CHIEF COMPLAINT:  Altered mental status.   HISTORY OF PRESENT ILLNESS: [name] is an 81 year old elderly Caucasian female with past medical history significant for progressively worsening dementia who lives at home with her son, and also a caregiver was brought in secondary to feeling weak, lethargic, and with altered mental status for two days. Because of her dementia the patient is not able to provide any history. Her son is the primary historian. According to him, the patient has been sick for the past one week with runny nose, some cough but no breathing problems, fevers, or other complaints. Over the past couple of days she has been extremely weak. She normally feeds herself and takes a few steps to the bathroom without any support, though slowly, but over the past couple of days she was not able to feed herself and was requiring assistance completely and has been incontinent.  The son thought maybe she was having a stroke and brought her to the ER. In the ED her blood work looks okay. CT of the head was negative for any acute infarct. She is able to move all four extremities. Chest x-ray shows possible right lower lobe infiltrate. Also elevated white count of 19,000.   PAST MEDICAL HISTORY:  1. Progressively worsening dementia.  2. History of breast cancer currently in remission.   PAST SURGICAL HISTORY:  1. Cholecystectomy.  2. Bilateral mastectomy.  3. Hysterectomy.  4. Left shoulder rotator cuff tear.   ALLERGIES: Codeine, morphine, and tape.   CURRENT MEDICATIONS:  1. Aspirin 81 mg p.o. daily.  2. Calcium carbonate 600 mg p.o. daily. 3. Multivitamin 1 tablet p.o. daily.  4. Zoloft 75 mg p.o. at bedtime.   SOCIAL HISTORY: Lives at home with son. No history of any smoking or alcohol use.    FAMILY HISTORY: No significant medical problems in the family. Both parents lived up to early 9890s and late 5580s.   REVIEW OF SYSTEMS: Difficult to obtain secondary to the patient's dementia. However, she does have significant pain in her neck when she tries to move it around. No photophobia or other symptoms.   PHYSICAL EXAMINATION:  VITAL SIGNS: Temperature is 97.4 degrees Fahrenheit, pulse 85, respirations 18, blood pressure 142/65, pulse oximetry 100% on room air.   GENERAL:  Elderly female lying in bed, not in any acute distress.   HEENT: Normocephalic, atraumatic. Pupils equal, round, reacting to light. Anicteric sclerae. Extraocular movements intact. Oropharynx clear without erythema, mass, or exudates. Dry mucous membranes.  NECK: Pain on moving, wincing with pain, unable to point to where exactly the pain is. No obvious external abnormalities are seen.   LUNGS: Clear. Moving air bilaterally. Rhonchi present in the right lung base. No wheeze or crackles. No use of accessory muscles for breathing.   CARDIOVASCULAR: S1, S2. Regular rate and rhythm. No murmurs, rubs, or gallops.   ABDOMEN: Soft, nontender, nondistended. No hepatosplenomegaly. Normal bowel sounds.   EXTREMITIES: No pedal edema. No clubbing or cyanosis. 2+ dorsalis pedis pulses palpable bilaterally.   SKIN: No acne, rash, or lesions.   LYMPHATICS: No cervical lymphadenopathy.   NEUROLOGIC: Because of her dementia the patient is not following any commands but able to move all four extremities.   PSYCH:  She is alert, awake, only oriented to self. Unable to recognize  her son. Just smiles and has irrelevant mumbling.   LABORATORY DATA: WBC 19.7, hemoglobin 14.3, hematocrit 43.7, platelet count is 75. Sodium 137, potassium 3.8, chloride 99, bicarbonate 29, BUN 10, creatinine 0.78. Glucose 114, calcium 9.1.   ALT 13, AST 14 alkaline phosphatase 112, total bilirubin 0.7, albumin 3.2. Troponin is negative. Urinalysis  negative for any infection. CT of the head showing no acute intracranial process and chronic microangiopathy changes. Chest x-ray showing fibrotic changes versus infiltrate in the right lower lobe.   ASSESSMENT AND PLAN: 81 year old female with history of dementia living at home, brought in for altered mental status, found to have pneumonia and also severe neck pain.  1. Right lower lobe pneumonia with leukocytosis: Blood cultures ordered and start on Rocephin and azithromycin. The patient is afebrile. No other source of infection found so far. Urinalysis is negative. 2. Neck pain: No recent fall. Muscle spasm versus vertebral injury. X-ray is negative. We will get a CT of the C-spine. Pain medications p.r.n.  3. Altered mental status: Likely metabolic encephalopathy from pneumonia versus progressively worsening dementia. Continue to monitor.  4. Dementia: Currently on Zoloft. Continue that and also place on risperidone p.r.n.  5. CODE STATUS: DO NOT RESUSCITATE as discussed with son at bedside.   TIME SPENT ON ADMISSION: 50 minutes.  ____________________________ Enid Baas, MD rk:bjt D: 04/01/2012 20:41:42 ET T: 04/02/2012 06:59:20 ET JOB#: [id number]  cc: Reola Mosher. Randa Lynn, MD Enid Baas MD ELECTRONICALLY SIGNED 04/02/2012 14:27
